# Patient Record
Sex: Female | Born: 1935 | Race: Black or African American | Hispanic: No | Smoking: Never smoker
Health system: Southern US, Community
[De-identification: ages and names within clinical notes are randomized; demographics above are authoritative.]

## PROBLEM LIST (undated history)

## (undated) DIAGNOSIS — I509 Heart failure, unspecified: Secondary | ICD-10-CM

## (undated) DIAGNOSIS — M1990 Unspecified osteoarthritis, unspecified site: Secondary | ICD-10-CM

## (undated) DIAGNOSIS — I10 Essential (primary) hypertension: Secondary | ICD-10-CM

## (undated) DIAGNOSIS — I4891 Unspecified atrial fibrillation: Secondary | ICD-10-CM

## (undated) DIAGNOSIS — Z950 Presence of cardiac pacemaker: Secondary | ICD-10-CM

## (undated) DIAGNOSIS — J449 Chronic obstructive pulmonary disease, unspecified: Secondary | ICD-10-CM

---

## 2007-08-28 ENCOUNTER — Emergency Department (HOSPITAL_BASED_OUTPATIENT_CLINIC_OR_DEPARTMENT_OTHER): Admission: EM | Admit: 2007-08-28 | Discharge: 2007-08-28 | Payer: Self-pay | Admitting: Emergency Medicine

## 2007-11-17 ENCOUNTER — Emergency Department (HOSPITAL_BASED_OUTPATIENT_CLINIC_OR_DEPARTMENT_OTHER): Admission: EM | Admit: 2007-11-17 | Discharge: 2007-11-18 | Payer: Self-pay | Admitting: Emergency Medicine

## 2009-02-20 ENCOUNTER — Emergency Department (HOSPITAL_COMMUNITY): Admission: EM | Admit: 2009-02-20 | Discharge: 2009-02-20 | Payer: Self-pay | Admitting: Emergency Medicine

## 2009-11-22 IMAGING — CR DG CHEST 2V
2 series · 2 of 2 positions shown · non-contrast
Comparison: None

CLINICAL DATA: Fever, nausea, headache, vomiting, shortness of
breath, cough, COPD

CHEST - 2 VIEW

[w chest pa]
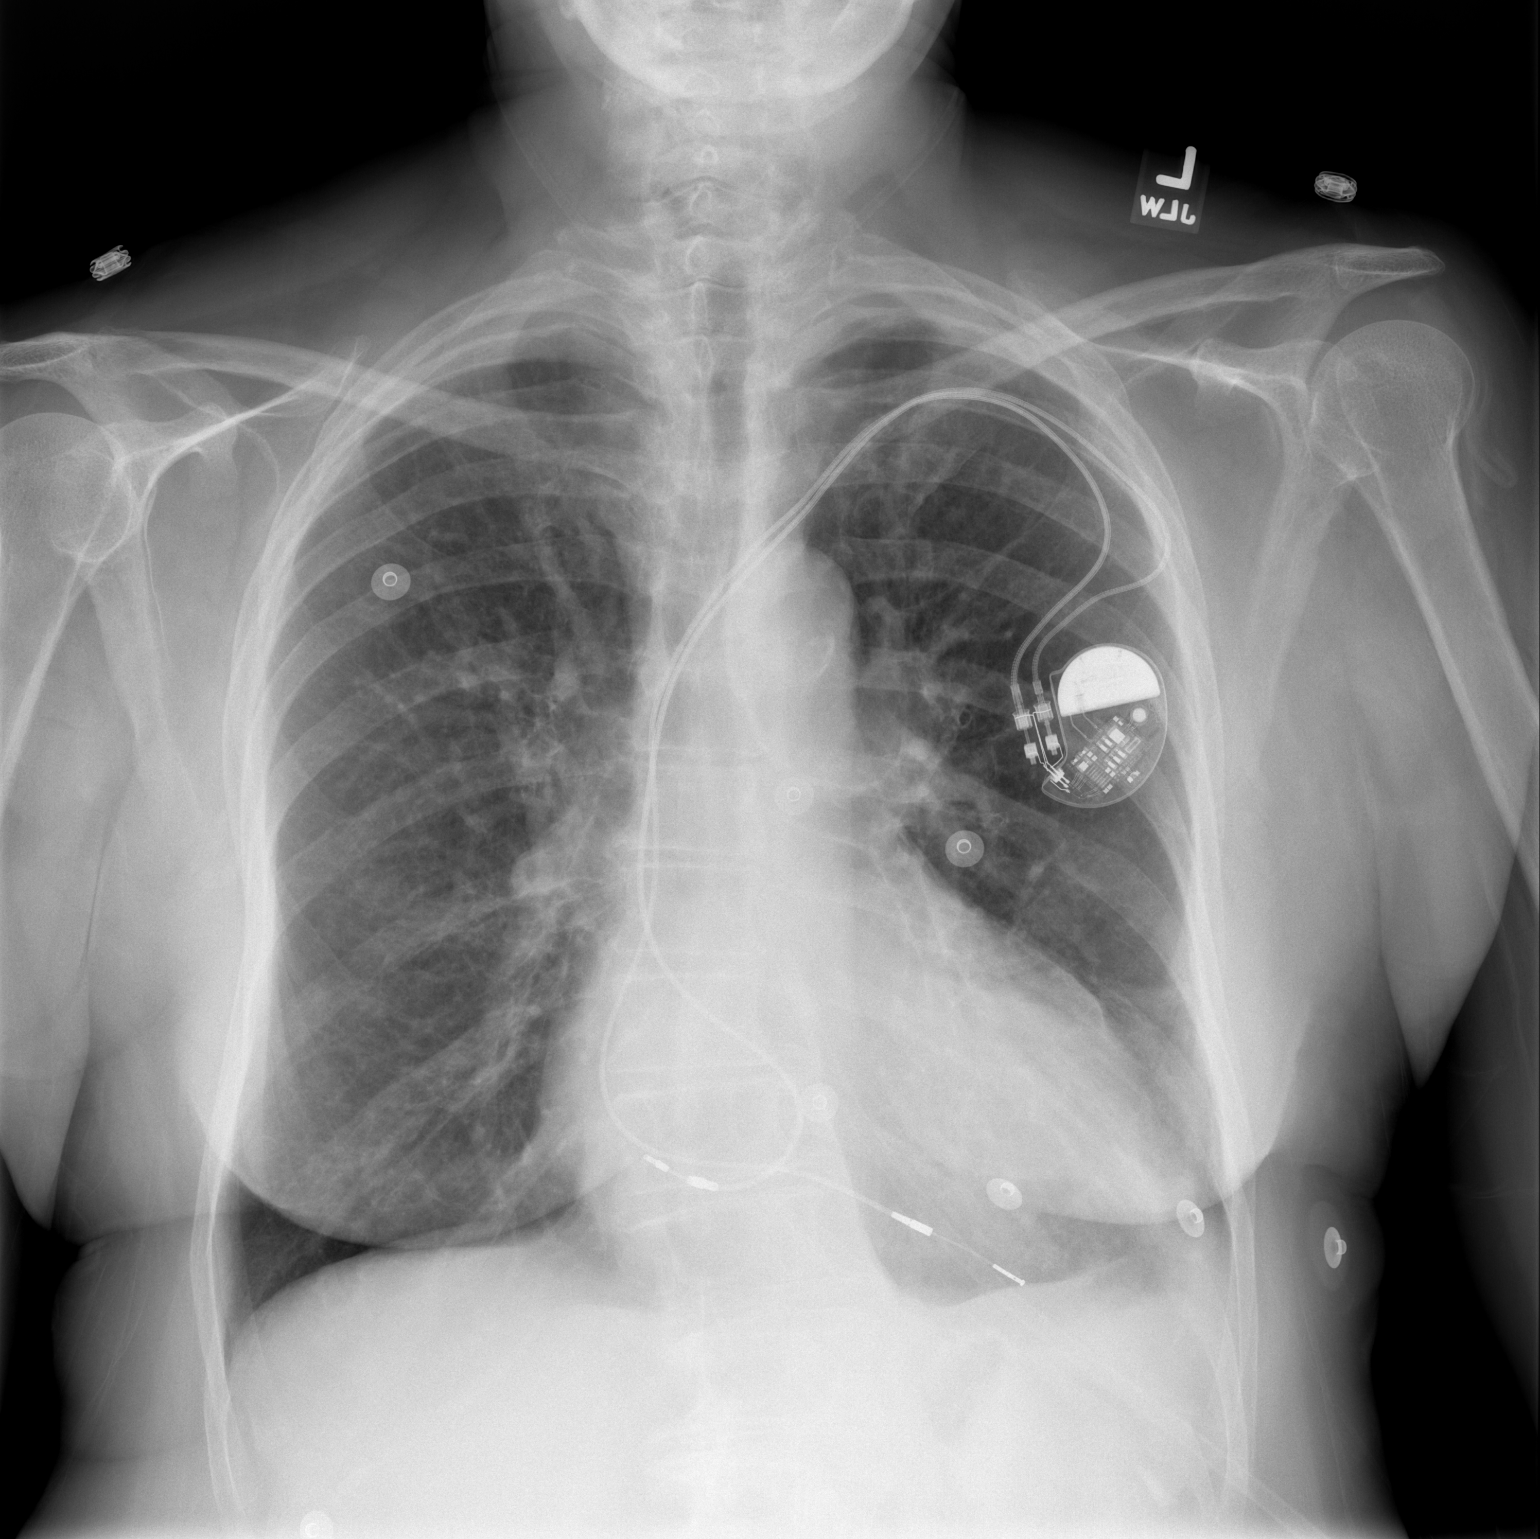

[w chest lat]
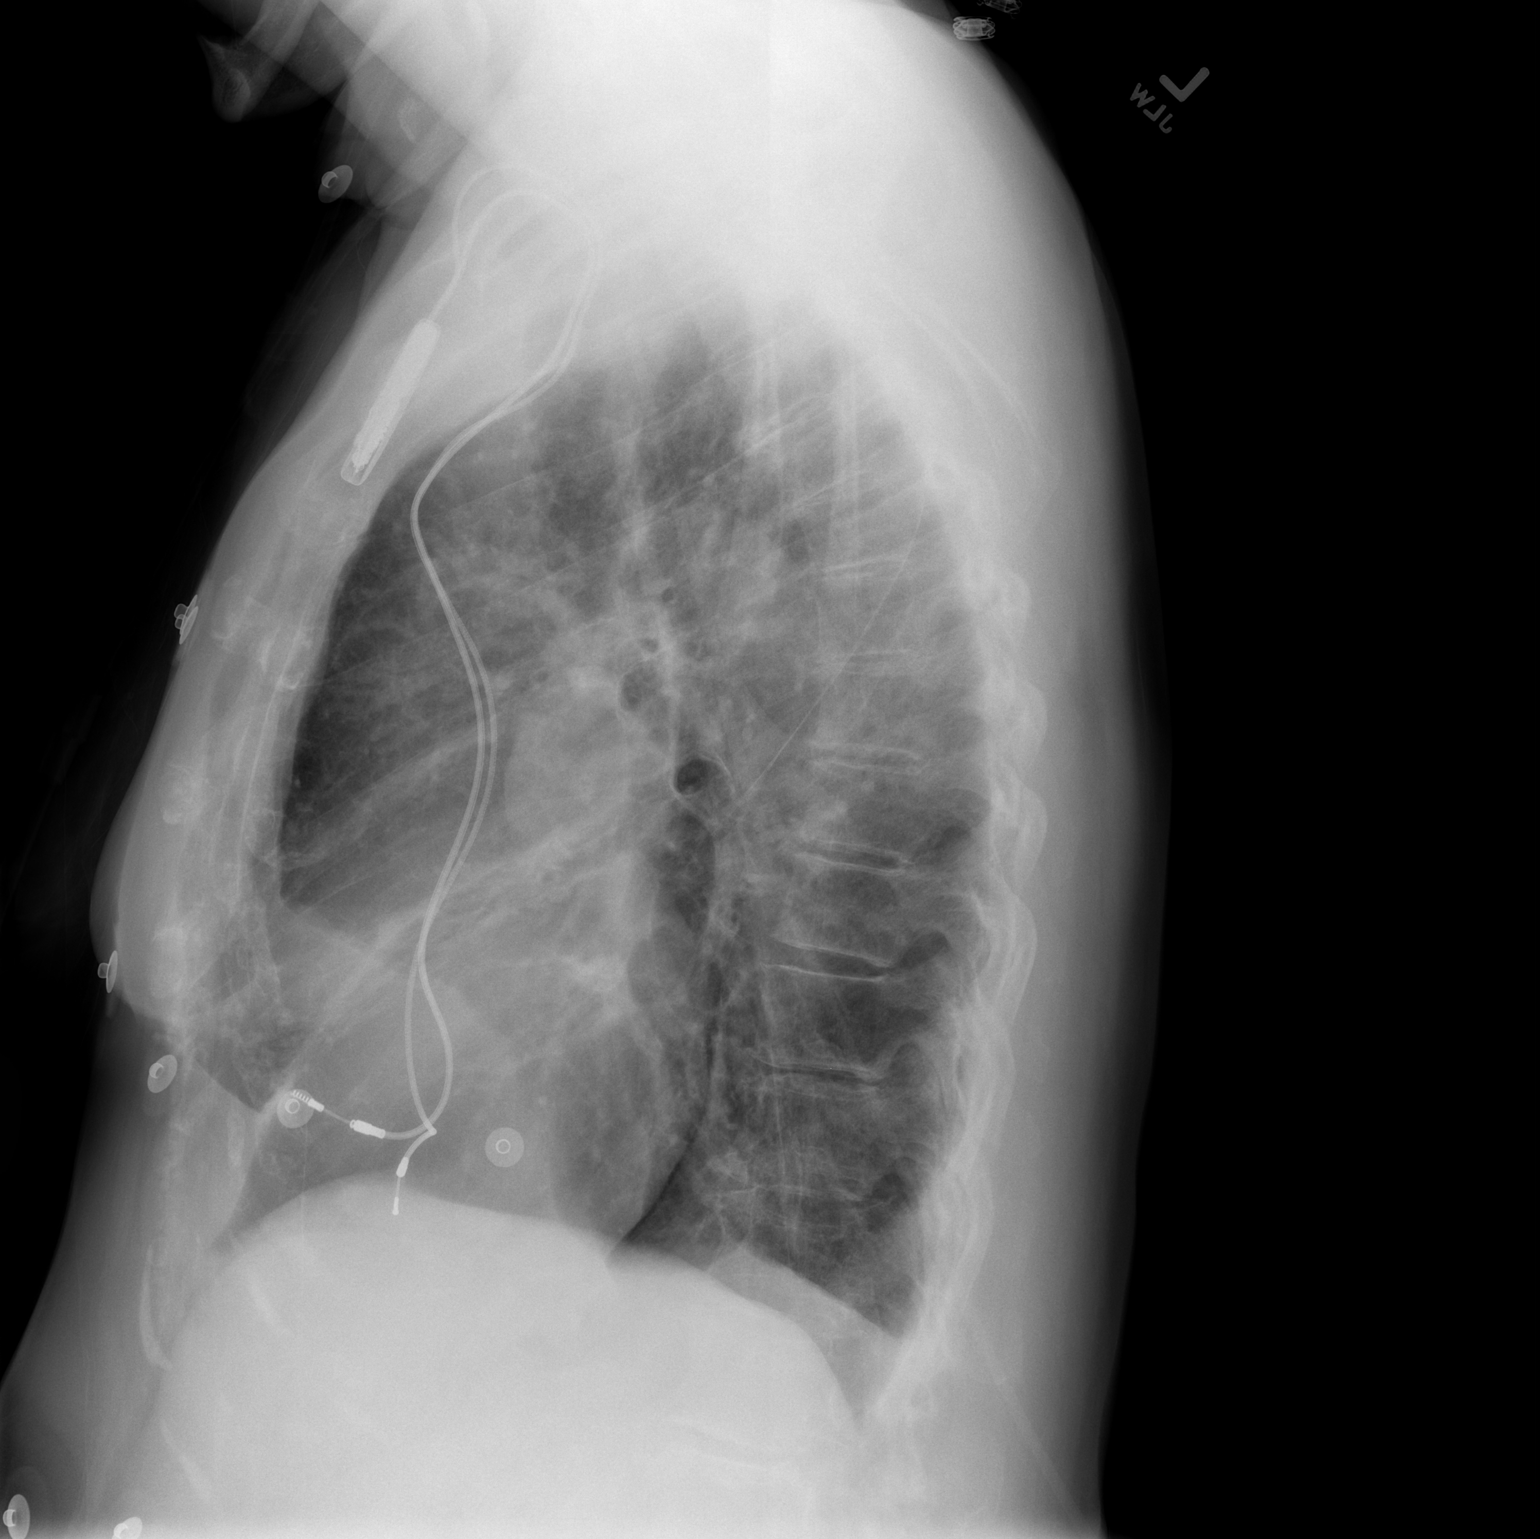

[2 of 2 positions shown; findings below may reference images not displayed]

FINDINGS: Cardiac enlargement with slight pulmonary vascular congestion.
Left subclavian transvenous pacemaker leads project at right atrium
and right ventricle.
Atherosclerotic calcification aortic arch.
Moderate bronchitic changes without definite infiltrate or
effusion.
Minimal atelectasis or scarring at left costophrenic angle.
Bony demineralization with thoracolumbar scoliosis.
IMPRESSION: Moderate bronchitic changes with atelctasis or scarring at left
costophrenic angle.

## 2010-05-28 LAB — POCT CARDIAC MARKERS
CKMB, poc: 1.7 ng/mL (ref 1.0–8.0)
Troponin i, poc: <0.05 ng/mL (ref 0.00–0.09)

## 2010-11-09 ENCOUNTER — Emergency Department (INDEPENDENT_AMBULATORY_CARE_PROVIDER_SITE_OTHER): Payer: Medicare Other

## 2010-11-09 ENCOUNTER — Other Ambulatory Visit: Payer: Self-pay

## 2010-11-09 ENCOUNTER — Encounter: Payer: Self-pay | Admitting: *Deleted

## 2010-11-09 ENCOUNTER — Emergency Department (HOSPITAL_BASED_OUTPATIENT_CLINIC_OR_DEPARTMENT_OTHER)
Admission: EM | Admit: 2010-11-09 | Discharge: 2010-11-09 | Disposition: A | Discharge status: Other Acute Inpatient Hospital | Payer: Medicare Other | Attending: Emergency Medicine | Admitting: Emergency Medicine

## 2010-11-09 DIAGNOSIS — J189 Pneumonia, unspecified organism: Secondary | ICD-10-CM | POA: Insufficient documentation

## 2010-11-09 DIAGNOSIS — I517 Cardiomegaly: Secondary | ICD-10-CM

## 2010-11-09 DIAGNOSIS — I10 Essential (primary) hypertension: Secondary | ICD-10-CM | POA: Insufficient documentation

## 2010-11-09 DIAGNOSIS — J4489 Other specified chronic obstructive pulmonary disease: Secondary | ICD-10-CM | POA: Insufficient documentation

## 2010-11-09 DIAGNOSIS — J90 Pleural effusion, not elsewhere classified: Secondary | ICD-10-CM

## 2010-11-09 DIAGNOSIS — J449 Chronic obstructive pulmonary disease, unspecified: Secondary | ICD-10-CM | POA: Insufficient documentation

## 2010-11-09 DIAGNOSIS — Z8739 Personal history of other diseases of the musculoskeletal system and connective tissue: Secondary | ICD-10-CM | POA: Insufficient documentation

## 2010-11-09 DIAGNOSIS — R0602 Shortness of breath: Secondary | ICD-10-CM | POA: Insufficient documentation

## 2010-11-09 HISTORY — DX: Essential (primary) hypertension: I10

## 2010-11-09 HISTORY — DX: Presence of cardiac pacemaker: Z95.0

## 2010-11-09 HISTORY — DX: Chronic obstructive pulmonary disease, unspecified: J44.9

## 2010-11-09 HISTORY — DX: Unspecified osteoarthritis, unspecified site: M19.90

## 2010-11-09 HISTORY — DX: Heart failure, unspecified: I50.9

## 2010-11-09 LAB — CBC
HCT: 35.2 % — ABNORMAL LOW (ref 36.0–46.0)
Hemoglobin: 12.1 g/dL (ref 12.0–15.0)
MCHC: 34.4 g/dL (ref 30.0–36.0)
RBC: 3.72 MIL/uL — ABNORMAL LOW (ref 3.87–5.11)
WBC: 7 10*3/uL (ref 4.0–10.5)

## 2010-11-09 LAB — BASIC METABOLIC PANEL
BUN: 11 mg/dL (ref 6–23)
CO2: 34 mEq/L — ABNORMAL HIGH (ref 19–32)
Chloride: 98 mEq/L (ref 96–112)
Creatinine, Ser: 0.7 mg/dL (ref 0.50–1.10)
GFR calc Af Amer: >60 mL/min (ref >60)
Potassium: 3.5 mEq/L (ref 3.5–5.1)

## 2010-11-09 LAB — PRO B NATRIURETIC PEPTIDE: Pro B Natriuretic peptide (BNP): 634 pg/mL — ABNORMAL HIGH (ref 0–450)

## 2010-11-09 LAB — DIFFERENTIAL
Basophils Relative: 0 % (ref 0–1)
Lymphocytes Relative: 14 % (ref 12–46)
Lymphs Abs: 1 10*3/uL (ref 0.7–4.0)
Monocytes Absolute: 0.7 10*3/uL (ref 0.1–1.0)
Monocytes Relative: 10 % (ref 3–12)
Neutro Abs: 5.3 10*3/uL (ref 1.7–7.7)
Neutrophils Relative %: 75 % (ref 43–77)

## 2010-11-09 LAB — PROTIME-INR
INR: 1.53 — ABNORMAL HIGH (ref 0.00–1.49)
Prothrombin Time: 18.7 seconds — ABNORMAL HIGH (ref 11.6–15.2)

## 2010-11-09 MED ORDER — METHYLPREDNISOLONE SODIUM SUCC 125 MG IJ SOLR
125.0000 mg | Freq: Once | INTRAMUSCULAR | Status: AC
  Administered 2010-11-09: 125 mg via INTRAVENOUS
  Filled 2010-11-09: qty 2

## 2010-11-09 MED ORDER — DEXTROSE 5 % IV SOLN
1.0000 g | Freq: Once | INTRAVENOUS | Status: AC
  Administered 2010-11-09: 1 g via INTRAVENOUS
  Filled 2010-11-09: qty 1

## 2010-11-09 MED ORDER — IPRATROPIUM BROMIDE 0.02 % IN SOLN
0.5000 mg | Freq: Once | RESPIRATORY_TRACT | Status: AC
  Administered 2010-11-09: 0.5 mg via RESPIRATORY_TRACT

## 2010-11-09 MED ORDER — IPRATROPIUM BROMIDE 0.02 % IN SOLN
0.5000 mg | Freq: Once | RESPIRATORY_TRACT | Status: DC
  Filled 2010-11-09: qty 2.5

## 2010-11-09 MED ORDER — ALBUTEROL SULFATE (5 MG/ML) 0.5% IN NEBU
2.5000 mg | INHALATION_SOLUTION | Freq: Once | RESPIRATORY_TRACT | Status: AC
  Administered 2010-11-09: 2.5 mg via RESPIRATORY_TRACT

## 2010-11-09 MED ORDER — ASPIRIN 81 MG PO CHEW
324.0000 mg | CHEWABLE_TABLET | Freq: Once | ORAL | Status: AC
  Administered 2010-11-09: 324 mg via ORAL
  Filled 2010-11-09: qty 4

## 2010-11-09 MED ORDER — ALBUTEROL SULFATE (5 MG/ML) 0.5% IN NEBU
2.5000 mg | INHALATION_SOLUTION | Freq: Once | RESPIRATORY_TRACT | Status: DC
  Filled 2010-11-09: qty 0.5

## 2010-11-09 MED ORDER — ALBUTEROL SULFATE (5 MG/ML) 0.5% IN NEBU
2.5000 mg | INHALATION_SOLUTION | Freq: Once | RESPIRATORY_TRACT | Status: AC
  Administered 2010-11-09: 2.5 mg via RESPIRATORY_TRACT
  Filled 2010-11-09: qty 0.5

## 2010-11-09 MED ORDER — DEXTROSE 5 % IV SOLN
500.0000 mg | Freq: Once | INTRAVENOUS | Status: AC
  Administered 2010-11-09: 500 mg via INTRAVENOUS
  Filled 2010-11-09: qty 500

## 2010-11-09 MED ORDER — IPRATROPIUM BROMIDE 0.02 % IN SOLN
0.5000 mg | Freq: Once | RESPIRATORY_TRACT | Status: AC
  Administered 2010-11-09: 0.5 mg via RESPIRATORY_TRACT
  Filled 2010-11-09: qty 2.5

## 2010-11-09 NOTE — ED Notes (Signed)
Report Called to accepting nurse.

## 2010-11-09 NOTE — ED Notes (Signed)
Sob. Chronic COPD.

## 2010-11-09 NOTE — ED Provider Notes (Signed)
History     CSN: [id number] Arrival date & time: 11/09/2010  1:16 PM   Chief Complaint  Patient presents with  . Shortness of Breath     (Include location/radiation/quality/duration/timing/severity/associated sxs/prior treatment) HPI Comments: Has had worsening sx for the past 2 weeks.  Admitted in january for similar sx and was dx with chf/copd.  Seen by pulmonology on 9/6 where additional inhaler added as had worsening PFTs.  Wears oxygen typically at night and exertion but has been wearing all day recently.  No relief with standard meds.  No edema.  No cp, n/v, f/c, abd pain  Patient is a 77 y.o. female presenting with shortness of breath. The history is provided by the patient. No language interpreter was used.  Shortness of Breath  The current episode started more than 1 week ago. The onset was gradual. The problem occurs continuously. The problem has been gradually worsening. The problem is moderate. The symptoms are relieved by nothing. The symptoms are aggravated by activity and a supine position. Associated symptoms include orthopnea, cough, shortness of breath and wheezing. Pertinent negatives include no chest pain, no chest pressure, no fever, no rhinorrhea, no sore throat and no stridor. The cough's precipitants include activity. The cough is non-productive. The cough is relieved by rest. The cough is worsened by activity. She has not inhaled smoke recently. She has had intermittent steroid use (steroid inhalers). She has had prior hospitalizations. She has had no prior ICU admissions. She has had no prior intubations.     Past Medical History  Diagnosis Date  . COPD (chronic obstructive pulmonary disease)   . CHF (congestive heart failure)   . Hypertension   . Pacemaker   . Arthritis      History reviewed. No pertinent past surgical history.  History reviewed. No pertinent family history.  History  Substance Use Topics  . Smoking status: Never Smoker   . Smokeless  tobacco: Not on file  . Alcohol Use: No    OB History    Grav Para Term Preterm Abortions TAB SAB Ect Mult Living                  Review of Systems  Constitutional: Positive for activity change and fatigue. Negative for fever, chills and appetite change.  HENT: Negative for congestion, sore throat, rhinorrhea, neck pain and neck stiffness.   Respiratory: Positive for cough, shortness of breath and wheezing. Negative for chest tightness and stridor.   Cardiovascular: Positive for orthopnea. Negative for chest pain and palpitations.  Gastrointestinal: Negative for nausea, vomiting and abdominal pain.  Genitourinary: Negative for dysuria, urgency, frequency and flank pain.  Musculoskeletal: Negative for back pain.  Skin: Negative for color change and rash.  Neurological: Negative for dizziness, weakness, light-headedness, numbness and headaches.  All other systems reviewed and are negative.    Allergies  Review of patient's allergies indicates no known allergies.  Home Medications   Current Outpatient Rx  Name Route Sig Dispense Refill  . ACETAMINOPHEN 325 MG PO TABS Oral Take 650 mg by mouth every 6 (six) hours as needed. pain     . ALBUTEROL SULFATE HFA 108 (90 BASE) MCG/ACT IN AERS Inhalation Inhale 2 puffs into the lungs 4 (four) times daily as needed. Shortness of breath and wheezing     . CICLESONIDE 160 MCG/ACT IN AERS Inhalation Inhale 1 puff into the lungs 2 (two) times daily.      Marland Kitchen DIGOXIN 0.125 MG PO TABS Oral Take 125 mcg  by mouth daily.      . FUROSEMIDE 20 MG PO TABS Oral Take 20 mg by mouth daily.      Marland Kitchen LOSARTAN POTASSIUM 100 MG PO TABS Oral Take 100 mg by mouth every evening.      . MOMETASONE FUROATE 50 MCG/ACT NA SUSP Nasal Place 2 sprays into the nose daily as needed. Allergies      . ONE-DAILY MULTI VITAMINS PO TABS Oral Take 1 tablet by mouth daily.      Marland Kitchen PRESERVISION/LUTEIN PO CAPS Oral Take 1 capsule by mouth 2 (two) times daily.      . NEBIVOLOL HCL 5  MG PO TABS Oral Take 5 mg by mouth daily.      Marland Kitchen POLYETHYLENE GLYCOL 3350 PO POWD Oral Take 17 g by mouth daily as needed. constipation     . POTASSIUM CHLORIDE CRYS CR 20 MEQ PO TBCR Oral Take 20 mEq by mouth 2 (two) times daily.      Marland Kitchen TIOTROPIUM BROMIDE MONOHYDRATE 18 MCG IN CAPS Inhalation Place 18 mcg into inhaler and inhale daily.      Marland Kitchen VERAPAMIL HCL 240 MG PO TBCR Oral Take 240 mg by mouth 2 (two) times daily.      . WARFARIN SODIUM 3 MG PO TABS Oral Take 6-7.5 mg by mouth daily. Take 2 tabs every day except on Thursday. On Thursday take 2 & 1/2 tabs     . ALBUTEROL SULFATE (2.5 MG/3ML) 0.083% IN NEBU Nebulization Take 2.5 mg by nebulization every 4 (four) hours as needed. Shortness of breath and wheezing       Physical Exam    BP 132/58  Pulse 90  Temp(Src) 98.1 F (36.7 C) (Oral)  Resp 26  SpO2 99%  Physical Exam  Nursing note and vitals reviewed. Constitutional: She appears well-developed and well-nourished. No distress.  HENT:  Head: Normocephalic and atraumatic.  Mouth/Throat: Oropharynx is clear and moist. No oropharyngeal exudate.  Eyes: Conjunctivae and EOM are normal. Pupils are equal, round, and reactive to light.  Neck: Normal range of motion. Neck supple.  Cardiovascular: Normal rate, regular rhythm, normal heart sounds and intact distal pulses.  Exam reveals no gallop and no friction rub.   No murmur heard. Pulmonary/Chest: Effort normal. No respiratory distress. She has wheezes (faint with diminished air exchange). She has no rales. She exhibits no tenderness.  Abdominal: Soft. Bowel sounds are normal. There is no tenderness.  Musculoskeletal: Normal range of motion. She exhibits no edema and no tenderness.  Skin: Skin is warm and dry. No rash noted.    ED Course  Procedures  Results for orders placed during the hospital encounter of 11/09/10  CBC      Component Value Range   WBC 7.0  4.0 - 10.5 (K/uL)   RBC 3.72 (*) 3.87 - 5.11 (MIL/uL)   Hemoglobin  12.1  12.0 - 15.0 (g/dL)   HCT 16.1 (*) 09.6 - 46.0 (%)   MCV 94.6  78.0 - 100.0 (fL)   MCH 32.5  26.0 - 34.0 (pg)   MCHC 34.4  30.0 - 36.0 (g/dL)   RDW 04.5  40.9 - 81.1 (%)   Platelets 181  150 - 400 (K/uL)  DIFFERENTIAL      Component Value Range   Neutrophils Relative 75  43 - 77 (%)   Neutro Abs 5.3  1.7 - 7.7 (K/uL)   Lymphocytes Relative 14  12 - 46 (%)   Lymphs Abs 1.0  0.7 - 4.0 (  K/uL)   Monocytes Relative 10  3 - 12 (%)   Monocytes Absolute 0.7  0.1 - 1.0 (K/uL)   Eosinophils Relative 1  0 - 5 (%)   Eosinophils Absolute 0.1  0.0 - 0.7 (K/uL)   Basophils Relative 0  0 - 1 (%)   Basophils Absolute 0.0  0.0 - 0.1 (K/uL)  BASIC METABOLIC PANEL      Component Value Range   Sodium 139  135 - 145 (mEq/L)   Potassium 3.5  3.5 - 5.1 (mEq/L)   Chloride 98  96 - 112 (mEq/L)   CO2 34 (*) 19 - 32 (mEq/L)   Glucose, Bld 78  70 - 99 (mg/dL)   BUN 11  6 - 23 (mg/dL)   Creatinine, Ser 1.61  0.50 - 1.10 (mg/dL)   Calcium 09.6  8.4 - 10.5 (mg/dL)   GFR calc non Af Amer >60  >60 (mL/min)   GFR calc Af Amer >60  >60 (mL/min)  LACTIC ACID, PLASMA      Component Value Range   Lactic Acid, Venous 1.0  0.5 - 2.2 (mmol/L)  TROPONIN I      Component Value Range   Troponin I <0.30  <0.30 (ng/mL)  PRO B NATRIURETIC PEPTIDE      Component Value Range   BNP, POC 634.0 (*) 0 - 450 (pg/mL)  PROTIME-INR      Component Value Range   Prothrombin Time 18.7 (*) 11.6 - 15.2 (seconds)   INR 1.53 (*) 0.00 - 1.49   APTT      Component Value Range   aPTT 39 (*) 24 - 37 (seconds)   No results found.   Date: 11/09/2010  Rate: 67  Rhythm: atrial fibrillation and paced  QRS Axis: normal  Intervals: PR prolonged  ST/T Wave abnormalities: nonspecific ST changes  Conduction Disutrbances:pvc  Narrative Interpretation:   Old EKG Reviewed: unchanged  1. COPD (chronic obstructive pulmonary disease)   2. Community acquired pneumonia   3. Pleural effusion      MDM Patient with multiple  etiologies to suggest or shortness of breath. I performed a cardiac workup was respiratory workup. Chest x-ray showed a left lower lobe infiltrate and associated left pleural effusion. Given this I will treat her for community-acquired pneumonia. She'll be placed on Rocephin and Zithromax. Blood cultures were obtained at the request of the inpatient provider [facility]. Additional laboratory studies performed such as CBC and BMP were unremarkable. Troponin was negative. Her BNP was elevated at 638. She received an aspirin. She received 2 doses of albuterol and Atrovent numerous department as well as a dose of IV steroids. Patient does have slight improvement of her symptoms but given the combination of COPD and the presence of pneumonia with pleural effusion I feel she requires inpatient evaluation. Patient was discussed with the admitting hospitalist Dr. Lovell Sheehan at [facility] who has accepted the patient for transfer       Dayton Bailiff, MD 11/09/10 1713

## 2010-11-09 NOTE — ED Notes (Signed)
Pt fed dinner, edp made aware.

## 2010-11-09 NOTE — ED Notes (Signed)
Pt placed on cardiac monitor when she arrived to exam room as well as 2 liters of oxygen via nasal cannula and oxygen saturation at 98 %

## 2010-11-16 LAB — CULTURE, BLOOD (ROUTINE X 2)
Culture  Setup Time: 201209150432
Culture: NO GROWTH
Special Requests: NORMAL

## 2010-11-22 LAB — URINALYSIS, ROUTINE W REFLEX MICROSCOPIC
Bilirubin Urine: NEGATIVE
Glucose, UA: NEGATIVE
Hgb urine dipstick: NEGATIVE
Ketones, ur: 15 — AB
Nitrite: NEGATIVE
Protein, ur: 100 — AB
Specific Gravity, Urine: 1.019
Urobilinogen, UA: 1
pH: 7

## 2010-11-22 LAB — BASIC METABOLIC PANEL WITH GFR
CO2: 31
Chloride: 97
GFR calc Af Amer: >60
Glucose, Bld: 111 — ABNORMAL HIGH
Sodium: 138

## 2010-11-22 LAB — CBC
HCT: 37.7
Hemoglobin: 13.1
MCHC: 34.8
MCV: 94.3
Platelets: 180
RBC: 3.99
RDW: 14
WBC: 6.8

## 2010-11-22 LAB — DIFFERENTIAL
Basophils Absolute: 0
Basophils Relative: 0
Eosinophils Absolute: 0
Eosinophils Relative: 0
Lymphocytes Relative: 9 — ABNORMAL LOW
Lymphs Abs: 0.6 — ABNORMAL LOW
Monocytes Absolute: 0.4
Monocytes Relative: 6
Neutro Abs: 5.8
Neutrophils Relative %: 85 — ABNORMAL HIGH

## 2010-11-22 LAB — POCT CARDIAC MARKERS
CKMB, poc: 2.3
Myoglobin, poc: 81.1
Operator id: 5506
Troponin i, poc: <0.05

## 2010-11-22 LAB — URINE MICROSCOPIC-ADD ON

## 2010-11-22 LAB — DIGOXIN LEVEL: Digoxin Level: 0.9

## 2010-11-22 LAB — BASIC METABOLIC PANEL
BUN: 12
Calcium: 9.5
Creatinine, Ser: 0.7
GFR calc non Af Amer: >60
Potassium: 3.4 — ABNORMAL LOW

## 2010-11-26 LAB — DIFFERENTIAL
Basophils Absolute: 0.1
Basophils Relative: 1
Eosinophils Absolute: 0.1
Eosinophils Relative: 1
Lymphocytes Relative: 12
Lymphs Abs: 1.2
Monocytes Absolute: 0.5
Monocytes Relative: 5
Neutro Abs: 7.8 — ABNORMAL HIGH
Neutrophils Relative %: 81 — ABNORMAL HIGH

## 2010-11-26 LAB — COMPREHENSIVE METABOLIC PANEL
ALT: 19
AST: 32
Albumin: 4.1
Alkaline Phosphatase: 104
CO2: 33 — ABNORMAL HIGH
Chloride: 100
Creatinine, Ser: 0.7
GFR calc Af Amer: >60
GFR calc non Af Amer: >60
Potassium: 3.3 — ABNORMAL LOW
Total Bilirubin: 0.6

## 2010-11-26 LAB — LIPASE, BLOOD: Lipase: 97

## 2010-11-26 LAB — CBC
HCT: 37.9
MCV: 94.8
Platelets: 182
RBC: 4
WBC: 9.7

## 2010-11-26 LAB — PROTIME-INR
INR: 1.8 — ABNORMAL HIGH
Prothrombin Time: 21.8 — ABNORMAL HIGH

## 2010-11-26 LAB — URINALYSIS, ROUTINE W REFLEX MICROSCOPIC
Bilirubin Urine: NEGATIVE
Glucose, UA: NEGATIVE
Hgb urine dipstick: NEGATIVE
Ketones, ur: NEGATIVE
Leukocytes, UA: NEGATIVE
Protein, ur: 100 — AB
pH: 7.5

## 2010-11-26 LAB — URINE MICROSCOPIC-ADD ON

## 2010-11-26 LAB — POCT CARDIAC MARKERS
CKMB, poc: 2.6
Myoglobin, poc: 125
Troponin i, poc: <0.05

## 2013-02-03 IMAGING — CR DG CHEST 2V
2 series · 2 of 2 positions shown · non-contrast
Comparison: 11/17/2007

CLINICAL DATA: Shortness of breath

CHEST - 2 VIEW

[w chest pa]
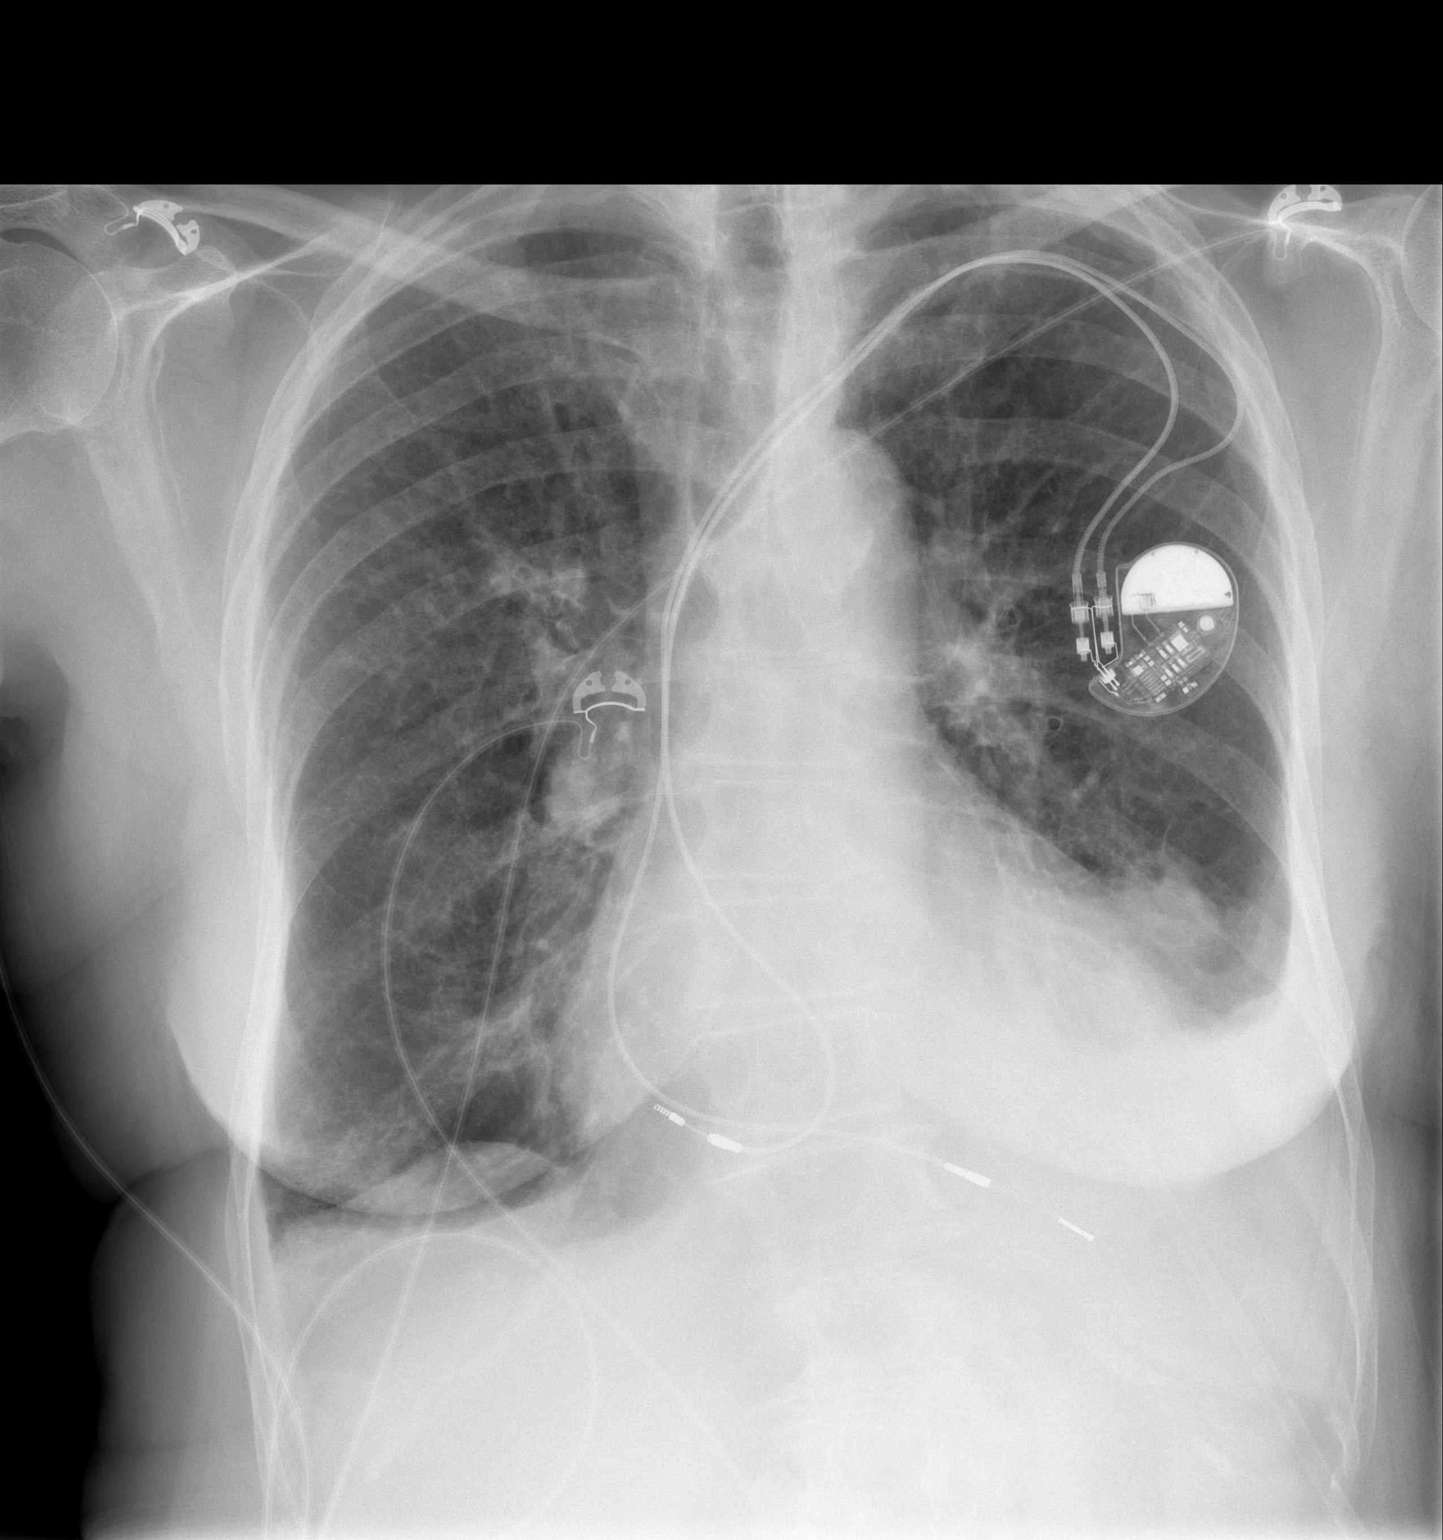

[w chest lat]
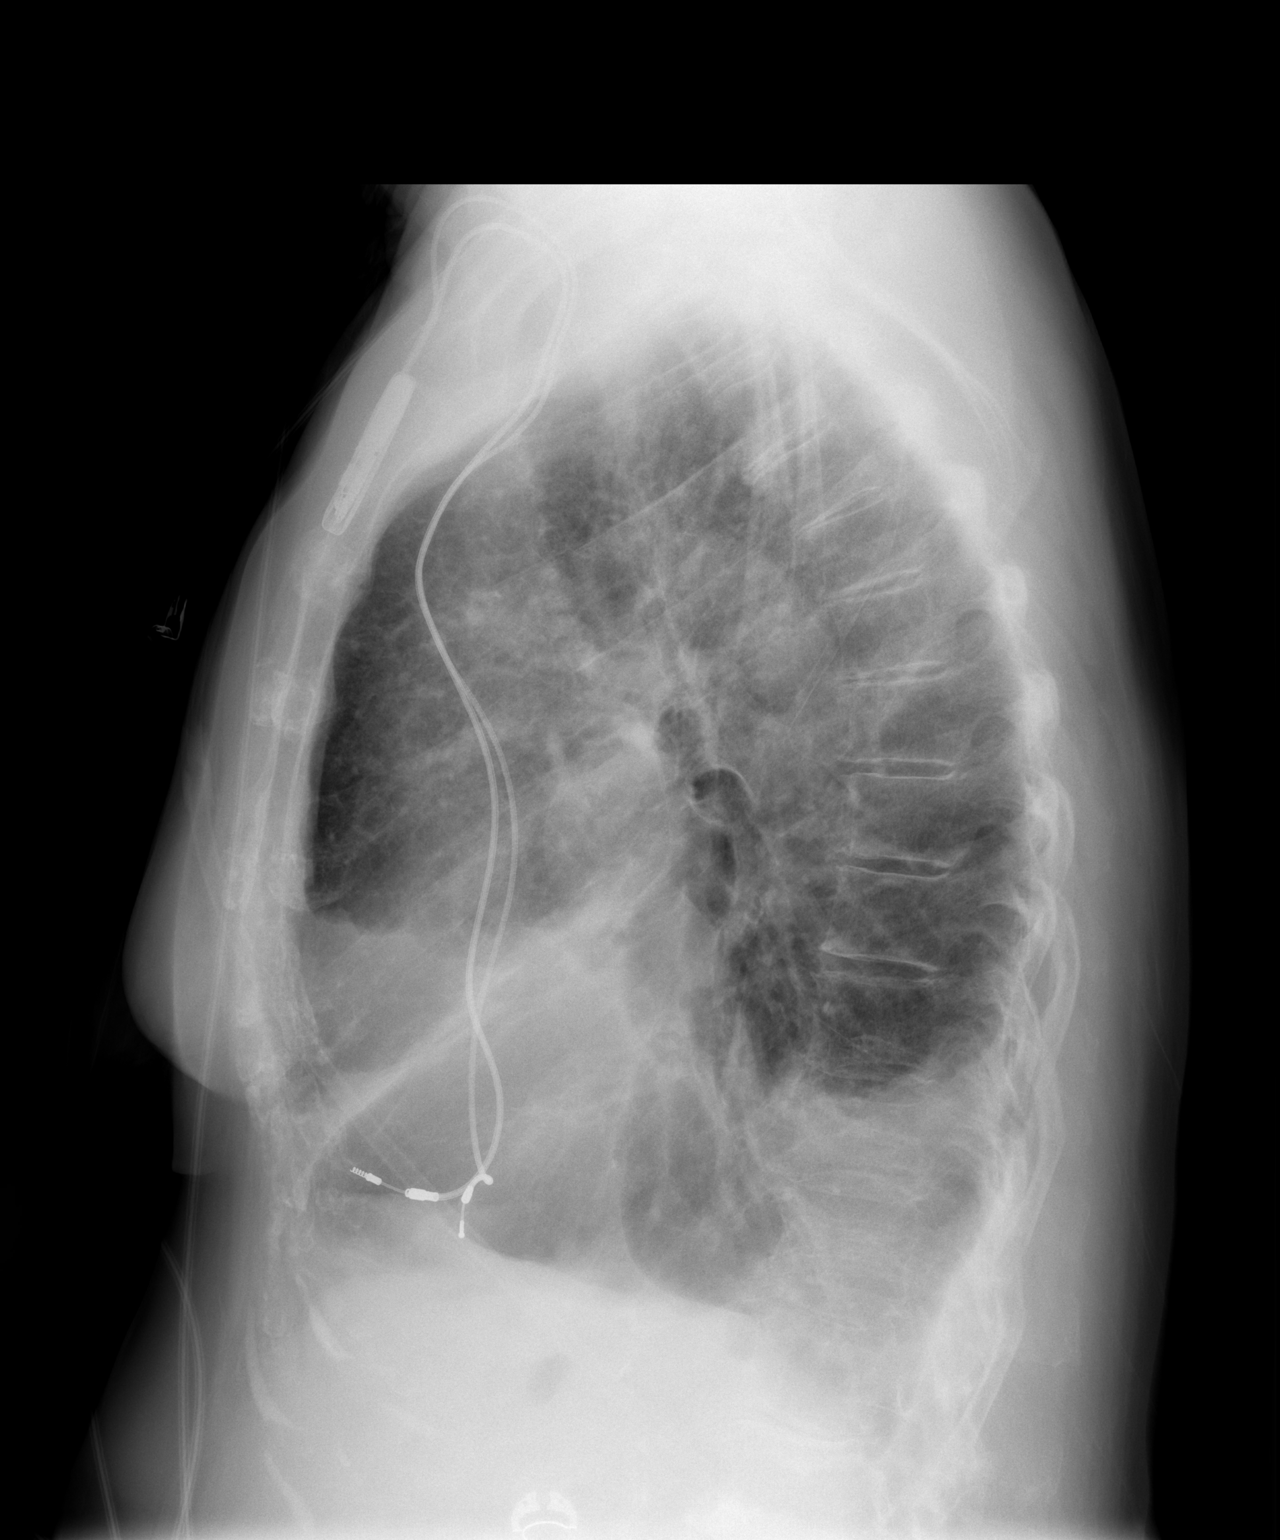

[2 of 2 positions shown; findings below may reference images not displayed]

FINDINGS: Chronic interstitial markings.  Left lower lobe opacity,
atelectasis versus pneumonia, with associated moderate left pleural
effusion.

Mild cardiomegaly.  Left subclavian pacemaker.

Mild degenerative changes of the visualized thoracolumbar spine.
IMPRESSION: Cardiomegaly with moderate left pleural effusion.

Left lower lobe opacity, atelectasis versus pneumonia.

## 2013-08-12 ENCOUNTER — Encounter (HOSPITAL_BASED_OUTPATIENT_CLINIC_OR_DEPARTMENT_OTHER): Payer: Self-pay | Admitting: Emergency Medicine

## 2013-08-12 ENCOUNTER — Emergency Department (HOSPITAL_BASED_OUTPATIENT_CLINIC_OR_DEPARTMENT_OTHER)
Admission: EM | Admit: 2013-08-12 | Discharge: 2013-08-12 | Disposition: A | Discharge status: Home / Self Care | Payer: Medicare Other | Attending: Emergency Medicine | Admitting: Emergency Medicine

## 2013-08-12 DIAGNOSIS — Z79899 Other long term (current) drug therapy: Secondary | ICD-10-CM | POA: Insufficient documentation

## 2013-08-12 DIAGNOSIS — R040 Epistaxis: Secondary | ICD-10-CM | POA: Diagnosis present

## 2013-08-12 DIAGNOSIS — Z7901 Long term (current) use of anticoagulants: Secondary | ICD-10-CM | POA: Diagnosis not present

## 2013-08-12 DIAGNOSIS — IMO0002 Reserved for concepts with insufficient information to code with codable children: Secondary | ICD-10-CM | POA: Diagnosis not present

## 2013-08-12 DIAGNOSIS — I4891 Unspecified atrial fibrillation: Secondary | ICD-10-CM | POA: Insufficient documentation

## 2013-08-12 DIAGNOSIS — I509 Heart failure, unspecified: Secondary | ICD-10-CM | POA: Diagnosis not present

## 2013-08-12 DIAGNOSIS — M129 Arthropathy, unspecified: Secondary | ICD-10-CM | POA: Insufficient documentation

## 2013-08-12 DIAGNOSIS — J449 Chronic obstructive pulmonary disease, unspecified: Secondary | ICD-10-CM | POA: Insufficient documentation

## 2013-08-12 DIAGNOSIS — Z950 Presence of cardiac pacemaker: Secondary | ICD-10-CM | POA: Insufficient documentation

## 2013-08-12 DIAGNOSIS — J4489 Other specified chronic obstructive pulmonary disease: Secondary | ICD-10-CM | POA: Insufficient documentation

## 2013-08-12 DIAGNOSIS — I10 Essential (primary) hypertension: Secondary | ICD-10-CM | POA: Insufficient documentation

## 2013-08-12 HISTORY — DX: Unspecified atrial fibrillation: I48.91

## 2013-08-12 LAB — CBC WITH DIFFERENTIAL/PLATELET
BASOS ABS: 0 10*3/uL (ref 0.0–0.1)
BASOS PCT: 0 % (ref 0–1)
EOS PCT: 2 % (ref 0–5)
Eosinophils Absolute: 0.1 10*3/uL (ref 0.0–0.7)
HEMATOCRIT: 34.2 % — AB (ref 36.0–46.0)
Hemoglobin: 11.8 g/dL — ABNORMAL LOW (ref 12.0–15.0)
LYMPHS PCT: 15 % (ref 12–46)
Lymphs Abs: 1.2 10*3/uL (ref 0.7–4.0)
MCH: 33.6 pg (ref 26.0–34.0)
MCHC: 34.5 g/dL (ref 30.0–36.0)
MCV: 97.4 fL (ref 78.0–100.0)
MONO ABS: 0.7 10*3/uL (ref 0.1–1.0)
Monocytes Relative: 9 % (ref 3–12)
Neutro Abs: 6.2 10*3/uL (ref 1.7–7.7)
Neutrophils Relative %: 75 % (ref 43–77)
Platelets: 159 10*3/uL (ref 150–400)
RBC: 3.51 MIL/uL — ABNORMAL LOW (ref 3.87–5.11)
RDW: 13.4 % (ref 11.5–15.5)
WBC: 8.3 10*3/uL (ref 4.0–10.5)

## 2013-08-12 LAB — BASIC METABOLIC PANEL
BUN: 18 mg/dL (ref 6–23)
CO2: 29 mEq/L (ref 19–32)
Calcium: 9.8 mg/dL (ref 8.4–10.5)
Chloride: 104 mEq/L (ref 96–112)
Creatinine, Ser: 0.9 mg/dL (ref 0.50–1.10)
GFR calc Af Amer: 64 mL/min — ABNORMAL LOW (ref >90)
GFR, EST NON AFRICAN AMERICAN: 55 mL/min — AB (ref >90)
GLUCOSE: 135 mg/dL — AB (ref 70–99)
POTASSIUM: 3.7 meq/L (ref 3.7–5.3)
Sodium: 144 mEq/L (ref 137–147)

## 2013-08-12 LAB — PROTIME-INR
INR: 2.15 — ABNORMAL HIGH (ref 0.00–1.49)
Prothrombin Time: 23.3 seconds — ABNORMAL HIGH (ref 11.6–15.2)

## 2013-08-12 MED ORDER — OXYMETAZOLINE HCL 0.05 % NA SOLN
1.0000 | Freq: Once | NASAL | Status: AC
  Administered 2013-08-12: 1 via NASAL

## 2013-08-12 MED ORDER — OXYMETAZOLINE HCL 0.05 % NA SOLN
NASAL | Status: AC
  Filled 2013-08-12: qty 15

## 2013-08-12 NOTE — ED Provider Notes (Signed)
CSN: [id number]     Arrival date & time 08/12/13  0747 History   First MD Initiated Contact with Patient 08/12/13 0754     Chief Complaint  Patient presents with  . Epistaxis     (Consider location/radiation/quality/duration/timing/severity/associated sxs/prior Treatment) Patient is a 80 y.o. female presenting with nosebleeds. The history is provided by the patient. No language interpreter was used.  Epistaxis Location:  Unable to specify Severity:  Moderate Duration:  1 week Timing:  Intermittent Progression:  Waxing and waning Chronicity:  New Context: anticoagulants, home oxygen and nose picking   Relieved by:  Applying pressure and vasoconstrictors Worsened by:  Nothing tried Ineffective treatments:  None tried Associated symptoms: blood in oropharynx   Associated symptoms: no congestion, no cough, no facial pain, no fever, no headaches, no sneezing, no sore throat and no syncope     Past Medical History  Diagnosis Date  . COPD (chronic obstructive pulmonary disease)   . CHF (congestive heart failure)   . Hypertension   . Pacemaker   . Arthritis   . A-fib    History reviewed. No pertinent past surgical history. No family history on file. History  Substance Use Topics  . Smoking status: Never Smoker   . Smokeless tobacco: Not on file  . Alcohol Use: No   OB History   Grav Para Term Preterm Abortions TAB SAB Ect Mult Living                 Review of Systems  Constitutional: Negative for fever, chills, diaphoresis, activity change, appetite change and fatigue.  HENT: Positive for nosebleeds. Negative for congestion, facial swelling, rhinorrhea, sneezing and sore throat.   Eyes: Negative for photophobia and discharge.  Respiratory: Negative for cough, chest tightness and shortness of breath.   Cardiovascular: Negative for chest pain, palpitations, leg swelling and syncope.  Gastrointestinal: Negative for nausea, vomiting, abdominal pain and diarrhea.  Endocrine:  Negative for polydipsia and polyuria.  Genitourinary: Negative for dysuria, frequency, difficulty urinating and pelvic pain.  Musculoskeletal: Negative for arthralgias, back pain, neck pain and neck stiffness.  Skin: Negative for color change and wound.  Allergic/Immunologic: Negative for immunocompromised state.  Neurological: Negative for facial asymmetry, weakness, numbness and headaches.  Hematological: Does not bruise/bleed easily.  Psychiatric/Behavioral: Negative for confusion and agitation.      Allergies  Review of patient's allergies indicates no known allergies.  Home Medications   Prior to Admission medications   Medication Sig Start Date End Date Taking? Authorizing Provider  acetaminophen (TYLENOL) 325 MG tablet Take 650 mg by mouth every 6 (six) hours as needed. pain    Yes Historical Provider, MD  albuterol (PROVENTIL HFA;VENTOLIN HFA) 108 (90 BASE) MCG/ACT inhaler Inhale 2 puffs into the lungs 4 (four) times daily as needed. Shortness of breath and wheezing    Yes Historical Provider, MD  albuterol (PROVENTIL) (2.5 MG/3ML) 0.083% nebulizer solution Take 2.5 mg by nebulization every 4 (four) hours as needed. Shortness of breath and wheezing    Yes Historical Provider, MD  atorvastatin (LIPITOR) 10 MG tablet Take 10 mg by mouth daily.   Yes Historical Provider, MD  budesonide-formoterol (SYMBICORT) 80-4.5 MCG/ACT inhaler Inhale 2 puffs into the lungs 2 (two) times daily.   Yes Historical Provider, MD  cholecalciferol (VITAMIN D) 1000 UNITS tablet Take 2,000 Units by mouth daily.   Yes Historical Provider, MD  digoxin (LANOXIN) 0.125 MG tablet Take 125 mcg by mouth daily.     Yes Historical Provider, MD  gabapentin (NEURONTIN) 100 MG capsule Take 100 mg by mouth 3 (three) times daily.   Yes Historical Provider, MD  HYDROcodone-acetaminophen (NORCO) 10-325 MG per tablet Take 1 tablet by mouth every 6 (six) hours as needed.   Yes Historical Provider, MD  losartan (COZAAR) 100  MG tablet Take 100 mg by mouth every evening.     Yes Historical Provider, MD  mometasone (NASONEX) 50 MCG/ACT nasal spray Place 2 sprays into the nose daily as needed. Allergies     Yes Historical Provider, MD  Multiple Vitamin (MULTIVITAMIN) tablet Take 1 tablet by mouth daily.     Yes Historical Provider, MD  Multiple Vitamins-Minerals (PRESERVISION/LUTEIN) CAPS Take 1 capsule by mouth 2 (two) times daily.     Yes Historical Provider, MD  nebivolol (BYSTOLIC) 5 MG tablet Take 5 mg by mouth daily.     Yes Historical Provider, MD  potassium chloride SA (K-DUR,KLOR-CON) 20 MEQ tablet Take 20 mEq by mouth 3 (three) times daily.    Yes Historical Provider, MD  tiotropium (SPIRIVA) 18 MCG inhalation capsule Place 18 mcg into inhaler and inhale daily.     Yes Historical Provider, MD  torsemide (DEMADEX) 20 MG tablet Take 20 mg by mouth daily.   Yes Historical Provider, MD  triamcinolone lotion (KENALOG) 0.1 % Apply 1 application topically 2 (two) times daily.   Yes Historical Provider, MD  verapamil (CALAN-SR) 240 MG CR tablet Take 240 mg by mouth 2 (two) times daily.     Yes Historical Provider, MD  warfarin (COUMADIN) 3 MG tablet Take 6 mg by mouth daily.    Yes Historical Provider, MD   BP 149/57  Pulse 88  Temp(Src) 98.9 F (37.2 C) (Oral)  Resp 18  Ht 5\' 5"  (1.651 m)  Wt 184 lb (83.462 kg)  BMI 30.62 kg/m2  SpO2 93% Physical Exam  Constitutional: She is oriented to person, place, and time. She appears well-developed and well-nourished. No distress.  HENT:  Head: Normocephalic and atraumatic.  Nose:    Mouth/Throat: No oropharyngeal exudate.    Eyes: Pupils are equal, round, and reactive to light.  Neck: Normal range of motion. Neck supple.  Cardiovascular: Normal rate, regular rhythm and normal heart sounds.  Exam reveals no gallop and no friction rub.   No murmur heard. Pulmonary/Chest: Effort normal and breath sounds normal. No respiratory distress. She has no wheezes. She has  no rales.  Abdominal: Soft. Bowel sounds are normal. She exhibits no distension and no mass. There is no tenderness. There is no rebound and no guarding.  Musculoskeletal: Normal range of motion. She exhibits no edema and no tenderness.  Neurological: She is alert and oriented to person, place, and time.  Skin: Skin is warm and dry.  Psychiatric: She has a normal mood and affect.    ED Course  Procedures (including critical care time) Labs Review Labs Reviewed  CBC WITH DIFFERENTIAL - Abnormal; Notable for the following:    RBC 3.51 (*)    Hemoglobin 11.8 (*)    HCT 34.2 (*)    All other components within normal limits  PROTIME-INR - Abnormal; Notable for the following:    Prothrombin Time 23.3 (*)    INR 2.15 (*)    All other components within normal limits  BASIC METABOLIC PANEL - Abnormal; Notable for the following:    Glucose, Bld 135 (*)    GFR calc non Af Amer 55 (*)    GFR calc Af Amer 64 (*)  All other components within normal limits    Imaging Review No results found.   EKG Interpretation None      MDM   Final diagnoses:  Epistaxis, recurrent    Pt is a 80 y.o. female with Pmhx as above on coumadin who presents with recurrent epistaxis. Pt had BL cauterization by ENT in [place] yesterday, had recurrent epistaxis this morning for about 30 mins, controlled with direct pressure & afrin. She is having mild bleeding down posterior oropharynx, but has no active bleeding from nare. Dried blood seen in L nare. VSS, pt in NAD, otherwise feels fatigued. No bleeding seen elsewhere. Will check coumadin level, hb. Afrin sprayed in BL nares. Will reexamine.    Bleeding appears controlled after Afrin spray. Pt HDS, Hb stable, INR theraputic. Will d/c home. Return precautions given for new or worsening symptoms including recurrent, uncontrolled bleeding, lightheadedness, passing out, blood in stool, urine. Pt and family instructed to use afrin 2 sprays, 2 times daily for next 2-3  days, no nose blowing or picking, use saline gel when wearing oxygen. Family will call the ENT in [place] she saw yesterday for f/u.        Shanna CiscoMegan E Docherty, MD 08/12/13 1012

## 2013-08-12 NOTE — Discharge Instructions (Signed)
Nosebleed °A nosebleed can be caused by many things, including: °· Getting hit hard in the nose. °· Infections. °· Dry nose. °· Colds. °· Medicines. °Your doctor may do lab testing if you get nosebleeds a lot and the cause is not known. °HOME CARE  °· If your nose was packed with material, keep it there until your doctor takes it out. Put the pack back in your nose if the pack falls out. °· Do not blow your nose for 12 hours after the nosebleed. °· Sit up and bend forward if your nose starts bleeding again. Pinch the front half of your nose nonstop for 20 minutes. °· Put petroleum jelly inside your nose every morning if you have a dry nose. °· Use a humidifier to make the air less dry. °· Do not take aspirin. °· Try not to strain, lift, or bend at the waist for many days after the nosebleed. °GET HELP RIGHT AWAY IF:  °· Nosebleeds keep happening and are hard to stop or control. °· You have bleeding or bruises that are not normal on other parts of the body. °· You have a fever. °· The nosebleeds get worse. °· You get lightheaded, feel faint, sweaty, or throw up (vomit) blood. °MAKE SURE YOU:  °· Understand these instructions. °· Will watch your condition. °· Will get help right away if you are not doing well or get worse. °Document Released: 11/21/2007 Document Revised: 05/06/2011 Document Reviewed: 11/21/2007 °[employer]® Patient Information ©2015 [employer]. This information is not intended to replace advice given to you by your health care provider. Make sure you discuss any questions you have with your health care provider. ° °

## 2013-08-12 NOTE — ED Notes (Signed)
Intermittent Nose bleed since Sunday.  Seen by ENT yesterday.  Was cauterized on right side.  Pt started bleeding this am again.  Continues to bleed.

## 2014-03-28 DEATH — deceased
